# Patient Record
Sex: Male | Born: 1995 | Race: White | Hispanic: Yes | Marital: Single | Smoking: Former smoker
Health system: Southern US, Community
[De-identification: ages and names within clinical notes are randomized; demographics above are authoritative.]

## PROBLEM LIST (undated history)

## (undated) DIAGNOSIS — W3400XA Accidental discharge from unspecified firearms or gun, initial encounter: Secondary | ICD-10-CM

## (undated) DIAGNOSIS — D649 Anemia, unspecified: Secondary | ICD-10-CM

## (undated) HISTORY — PX: LAPAROSCOPIC SMALL BOWEL RESECTION: SUR793

## (undated) HISTORY — DX: Anemia, unspecified: D64.9

## (undated) HISTORY — PX: APPENDECTOMY: SHX54

## (undated) HISTORY — DX: Accidental discharge from unspecified firearms or gun, initial encounter: W34.00XA

## (undated) HISTORY — PX: THROMBECTOMY: PRO61

---
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
## 2014-10-24 ENCOUNTER — Encounter: Payer: Self-pay | Admitting: Pediatrics

## 2016-11-28 ENCOUNTER — Encounter (HOSPITAL_COMMUNITY): Payer: Self-pay | Admitting: Emergency Medicine

## 2016-11-28 ENCOUNTER — Emergency Department (HOSPITAL_COMMUNITY)
Admission: EM | Admit: 2016-11-28 | Discharge: 2016-11-28 | Disposition: A | Discharge status: Home / Self Care | Payer: Medicaid Other | Attending: Physician Assistant | Admitting: Physician Assistant

## 2016-11-28 DIAGNOSIS — Y999 Unspecified external cause status: Secondary | ICD-10-CM | POA: Insufficient documentation

## 2016-11-28 DIAGNOSIS — Y929 Unspecified place or not applicable: Secondary | ICD-10-CM | POA: Insufficient documentation

## 2016-11-28 DIAGNOSIS — T1592XA Foreign body on external eye, part unspecified, left eye, initial encounter: Secondary | ICD-10-CM | POA: Insufficient documentation

## 2016-11-28 DIAGNOSIS — X58XXXA Exposure to other specified factors, initial encounter: Secondary | ICD-10-CM | POA: Insufficient documentation

## 2016-11-28 DIAGNOSIS — Z87891 Personal history of nicotine dependence: Secondary | ICD-10-CM | POA: Insufficient documentation

## 2016-11-28 DIAGNOSIS — Y939 Activity, unspecified: Secondary | ICD-10-CM | POA: Insufficient documentation

## 2016-11-28 MED ORDER — POLYMYXIN B-TRIMETHOPRIM 10000-0.1 UNIT/ML-% OP SOLN
1.0000 [drp] | OPHTHALMIC | Status: DC
  Administered 2016-11-28: 1 [drp] via OPHTHALMIC
  Filled 2016-11-28: qty 10

## 2016-11-28 MED ORDER — FLUORESCEIN SODIUM 0.6 MG OP STRP
1.0000 | ORAL_STRIP | Freq: Once | OPHTHALMIC | Status: AC
  Administered 2016-11-28: 1 via OPHTHALMIC
  Filled 2016-11-28: qty 1

## 2016-11-28 MED ORDER — TETRACAINE HCL 0.5 % OP SOLN
2.0000 [drp] | Freq: Once | OPHTHALMIC | Status: AC
  Administered 2016-11-28: 2 [drp] via OPHTHALMIC
  Filled 2016-11-28: qty 2
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
## 2016-11-28 NOTE — ED Notes (Signed)
Waiting for Polytrim to arrive from main pharmacy

## 2016-11-28 NOTE — ED Triage Notes (Signed)
Pt states he was working under a car removing a screw and something fell in L eye 3 hours ago.  C/o pain and blurred vision to L eye.

## 2017-12-23 ENCOUNTER — Encounter (HOSPITAL_COMMUNITY): Payer: Self-pay | Admitting: Emergency Medicine

## 2017-12-23 ENCOUNTER — Other Ambulatory Visit: Payer: Self-pay

## 2017-12-23 ENCOUNTER — Emergency Department (HOSPITAL_COMMUNITY)
Admission: EM | Admit: 2017-12-23 | Discharge: 2017-12-23 | Disposition: A | Discharge status: Home / Self Care | Payer: Self-pay | Attending: Emergency Medicine | Admitting: Emergency Medicine

## 2017-12-23 DIAGNOSIS — Y9389 Activity, other specified: Secondary | ICD-10-CM | POA: Insufficient documentation

## 2017-12-23 DIAGNOSIS — S0502XA Injury of conjunctiva and corneal abrasion without foreign body, left eye, initial encounter: Secondary | ICD-10-CM | POA: Insufficient documentation

## 2017-12-23 DIAGNOSIS — X58XXXA Exposure to other specified factors, initial encounter: Secondary | ICD-10-CM | POA: Insufficient documentation

## 2017-12-23 DIAGNOSIS — Z79899 Other long term (current) drug therapy: Secondary | ICD-10-CM | POA: Insufficient documentation

## 2017-12-23 DIAGNOSIS — Y990 Civilian activity done for income or pay: Secondary | ICD-10-CM | POA: Insufficient documentation

## 2017-12-23 DIAGNOSIS — Y9289 Other specified places as the place of occurrence of the external cause: Secondary | ICD-10-CM | POA: Insufficient documentation

## 2017-12-23 DIAGNOSIS — R067 Sneezing: Secondary | ICD-10-CM | POA: Insufficient documentation

## 2017-12-23 DIAGNOSIS — J302 Other seasonal allergic rhinitis: Secondary | ICD-10-CM | POA: Insufficient documentation

## 2017-12-23 DIAGNOSIS — Z87891 Personal history of nicotine dependence: Secondary | ICD-10-CM | POA: Insufficient documentation

## 2017-12-23 MED ORDER — ERYTHROMYCIN 5 MG/GM OP OINT: TOPICAL_OINTMENT | OPHTHALMIC | 0 refills | Status: AC

## 2017-12-23 MED ORDER — LORATADINE 10 MG PO TABS
10.0000 mg | ORAL_TABLET | Freq: Once | ORAL | Status: AC
  Administered 2017-12-23: 10 mg via ORAL
  Filled 2017-12-23: qty 1

## 2017-12-23 MED ORDER — FLUTICASONE PROPIONATE 50 MCG/ACT NA SUSP: 1.0000 | Freq: Every day | NASAL | 2 refills | Status: AC

## 2017-12-23 MED ORDER — TETRACAINE HCL 0.5 % OP SOLN
1.0000 [drp] | Freq: Once | OPHTHALMIC | Status: AC
  Administered 2017-12-23: 1 [drp] via OPHTHALMIC
  Filled 2017-12-23: qty 4

## 2017-12-23 MED ORDER — CETIRIZINE HCL 10 MG PO TABS: 10.0000 mg | ORAL_TABLET | Freq: Every day | ORAL | 1 refills | Status: AC

## 2017-12-23 MED ORDER — FLUORESCEIN SODIUM 1 MG OP STRP
1.0000 | ORAL_STRIP | Freq: Once | OPHTHALMIC | Status: AC
  Administered 2017-12-23: 1 via OPHTHALMIC
  Filled 2017-12-23: qty 1

## 2017-12-23 MED ORDER — SALINE SPRAY 0.65 % NA SOLN: 1.0000 | NASAL | 0 refills | Status: AC | PRN

## 2017-12-23 NOTE — ED Notes (Signed)
PT states understanding of care given, follow up care, and medication prescribed. PT ambulated from ED to car with a steady gait. 

## 2017-12-23 NOTE — ED Triage Notes (Signed)
Pt c/o cold like symptoms for the past few days with nasal congestion, no fever or chills.

## 2017-12-23 NOTE — ED Notes (Signed)
Patient is A&Ox4.  No signs of distress noted.  Please see providers complete history and physical exam.  

## 2017-12-23 NOTE — Discharge Instructions (Addendum)
As discussed, make sure that you take an antihistamine daily and use nasal spray to help flush your sinuses.  Follow-up with ophthalmologist Dr Dione BoozeGroat in the next 2 days by calling the number above. Return if symptoms worsen, fever, chills, headache, visual disturbances vomiting or other new concerning symptoms in the meantime.
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
## 2018-06-13 ENCOUNTER — Emergency Department (HOSPITAL_COMMUNITY): Payer: No Typology Code available for payment source

## 2018-06-13 ENCOUNTER — Encounter (HOSPITAL_COMMUNITY): Payer: Self-pay | Admitting: Emergency Medicine

## 2018-06-13 ENCOUNTER — Emergency Department (HOSPITAL_COMMUNITY)
Admission: EM | Admit: 2018-06-13 | Discharge: 2018-06-13 | Disposition: A | Discharge status: Home / Self Care | Payer: No Typology Code available for payment source | Attending: Emergency Medicine | Admitting: Emergency Medicine

## 2018-06-13 ENCOUNTER — Other Ambulatory Visit: Payer: Self-pay

## 2018-06-13 DIAGNOSIS — Y929 Unspecified place or not applicable: Secondary | ICD-10-CM | POA: Diagnosis not present

## 2018-06-13 DIAGNOSIS — Z23 Encounter for immunization: Secondary | ICD-10-CM | POA: Diagnosis not present

## 2018-06-13 DIAGNOSIS — S81812A Laceration without foreign body, left lower leg, initial encounter: Secondary | ICD-10-CM | POA: Diagnosis not present

## 2018-06-13 DIAGNOSIS — Y990 Civilian activity done for income or pay: Secondary | ICD-10-CM | POA: Insufficient documentation

## 2018-06-13 DIAGNOSIS — Z87891 Personal history of nicotine dependence: Secondary | ICD-10-CM | POA: Diagnosis not present

## 2018-06-13 DIAGNOSIS — Y93H2 Activity, gardening and landscaping: Secondary | ICD-10-CM | POA: Diagnosis not present

## 2018-06-13 DIAGNOSIS — S8992XA Unspecified injury of left lower leg, initial encounter: Secondary | ICD-10-CM | POA: Diagnosis present

## 2018-06-13 DIAGNOSIS — W293XXA Contact with powered garden and outdoor hand tools and machinery, initial encounter: Secondary | ICD-10-CM | POA: Insufficient documentation

## 2018-06-13 MED ORDER — ACETAMINOPHEN 325 MG PO TABS
650.0000 mg | ORAL_TABLET | Freq: Once | ORAL | Status: AC
  Administered 2018-06-13: 650 mg via ORAL
  Filled 2018-06-13: qty 2

## 2018-06-13 MED ORDER — LIDOCAINE-EPINEPHRINE (PF) 2 %-1:200000 IJ SOLN
10.0000 mL | Freq: Once | INTRAMUSCULAR | Status: AC
  Administered 2018-06-13: 10 mL
  Filled 2018-06-13: qty 20

## 2018-06-13 MED ORDER — TETANUS-DIPHTH-ACELL PERTUSSIS 5-2.5-18.5 LF-MCG/0.5 IM SUSP
0.5000 mL | Freq: Once | INTRAMUSCULAR | Status: AC
  Administered 2018-06-13: 0.5 mL via INTRAMUSCULAR
  Filled 2018-06-13: qty 0.5

## 2018-06-13 NOTE — ED Notes (Signed)
ED Provider at bedside. 

## 2018-06-13 NOTE — Discharge Instructions (Signed)
I have placed 11 stiches to your left leg, please have them (11 sutures) removed in 10 days. Please return to the ED if you experience any of the following symptoms:  You develop severe swelling around the injury site. Your pain suddenly increases and is severe. You develop painful lumps near the wound or on skin that is anywhere on your body. You have a red streak going away from your wound. The wound is on your hand or foot and you cannot properly move a finger or toe. The wound is on your hand or foot and you notice that your fingers or toes look pale or bluish.

## 2018-06-13 NOTE — ED Notes (Signed)
Pt verbalized understanding discharge instructions and denies any further needs or questions at this time. VS stable, ambulatory and steady gait.   See EDP assessment / note.   
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
## 2018-06-13 NOTE — ED Notes (Signed)
Patient transported to X-ray 

## 2018-06-13 NOTE — ED Triage Notes (Signed)
Pt. Stated, I was sawing with chain saw and it got stuck and jumped back and cut my left leg.

## 2018-06-23 ENCOUNTER — Emergency Department (HOSPITAL_COMMUNITY)
Admission: EM | Admit: 2018-06-23 | Discharge: 2018-06-23 | Disposition: A | Discharge status: Home / Self Care | Payer: No Typology Code available for payment source | Attending: Emergency Medicine | Admitting: Emergency Medicine

## 2018-06-23 ENCOUNTER — Encounter (HOSPITAL_COMMUNITY): Payer: Self-pay

## 2018-06-23 DIAGNOSIS — Z4802 Encounter for removal of sutures: Secondary | ICD-10-CM | POA: Diagnosis not present

## 2018-06-23 NOTE — ED Triage Notes (Signed)
Pt presents for suture removal. Sutures placed 8/6 after laceration to L lower leg from chainsaw. Patient denies S/S of infection.
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
## 2018-06-23 NOTE — Discharge Instructions (Addendum)
You stitches were removed today and the skin was closed without areas of redness or swelling. You can keep taking Tylenol for pain if needed. Return if any new or concerning symptoms.  Get help right away if: You have a red streak coming from your cut. Your cut bleeds through the bandage and the bleeding does not stop with gentle pressure. The edges of your cut open up and separate. You have very bad (severe) pain. You have a rash. You are confused. You pass out (faint). You have trouble breathing and you have a fast heartbeat.

## 2018-12-19 ENCOUNTER — Emergency Department (HOSPITAL_BASED_OUTPATIENT_CLINIC_OR_DEPARTMENT_OTHER)
Admit: 2018-12-19 | Discharge: 2018-12-19 | Disposition: A | Discharge status: Home / Self Care | Payer: Self-pay | Attending: Emergency Medicine | Admitting: Emergency Medicine

## 2018-12-19 ENCOUNTER — Emergency Department (HOSPITAL_COMMUNITY): Payer: Self-pay

## 2018-12-19 ENCOUNTER — Emergency Department (HOSPITAL_COMMUNITY)
Admission: EM | Admit: 2018-12-19 | Discharge: 2018-12-19 | Disposition: A | Discharge status: Home / Self Care | Payer: Self-pay | Attending: Emergency Medicine | Admitting: Emergency Medicine

## 2018-12-19 ENCOUNTER — Encounter (HOSPITAL_COMMUNITY): Payer: Self-pay | Admitting: Emergency Medicine

## 2018-12-19 DIAGNOSIS — R609 Edema, unspecified: Secondary | ICD-10-CM

## 2018-12-19 DIAGNOSIS — Z87891 Personal history of nicotine dependence: Secondary | ICD-10-CM | POA: Insufficient documentation

## 2018-12-19 DIAGNOSIS — L03116 Cellulitis of left lower limb: Secondary | ICD-10-CM | POA: Insufficient documentation

## 2018-12-19 DIAGNOSIS — Z79899 Other long term (current) drug therapy: Secondary | ICD-10-CM | POA: Insufficient documentation

## 2018-12-19 DIAGNOSIS — L0390 Cellulitis, unspecified: Secondary | ICD-10-CM

## 2018-12-19 LAB — CBC WITH DIFFERENTIAL/PLATELET
Abs Immature Granulocytes: 0.02 10*3/uL (ref 0.00–0.07)
Basophils Absolute: 0.1 10*3/uL (ref 0.0–0.1)
Basophils Relative: 1 %
Eosinophils Absolute: 0.1 10*3/uL (ref 0.0–0.5)
Eosinophils Relative: 1 %
HCT: 44.2 % (ref 39.0–52.0)
Hemoglobin: 14.2 g/dL (ref 13.0–17.0)
Immature Granulocytes: 0 %
Lymphocytes Relative: 18 %
Lymphs Abs: 1.8 10*3/uL (ref 0.7–4.0)
MCH: 27.9 pg (ref 26.0–34.0)
MCHC: 32.1 g/dL (ref 30.0–36.0)
MCV: 86.8 fL (ref 80.0–100.0)
Monocytes Absolute: 0.8 10*3/uL (ref 0.1–1.0)
Monocytes Relative: 8 %
Neutro Abs: 7.1 10*3/uL (ref 1.7–7.7)
Neutrophils Relative %: 72 %
Platelets: 228 10*3/uL (ref 150–400)
RBC: 5.09 MIL/uL (ref 4.22–5.81)
RDW: 12.5 % (ref 11.5–15.5)
WBC: 10 10*3/uL (ref 4.0–10.5)
nRBC: 0 % (ref 0.0–0.2)

## 2018-12-19 LAB — BASIC METABOLIC PANEL
Anion gap: 12 (ref 5–15)
BUN: 13 mg/dL (ref 6–20)
CO2: 23 mmol/L (ref 22–32)
Calcium: 9.3 mg/dL (ref 8.9–10.3)
Chloride: 103 mmol/L (ref 98–111)
Creatinine, Ser: 1.08 mg/dL (ref 0.61–1.24)
GFR calc Af Amer: >60 mL/min (ref >60)
GFR calc non Af Amer: >60 mL/min (ref >60)
Glucose, Bld: 132 mg/dL — ABNORMAL HIGH (ref 70–99)
Potassium: 3.8 mmol/L (ref 3.5–5.1)
Sodium: 138 mmol/L (ref 135–145)

## 2018-12-19 MED ORDER — CEPHALEXIN 250 MG PO CAPS
500.0000 mg | ORAL_CAPSULE | Freq: Once | ORAL | Status: AC
  Administered 2018-12-19: 500 mg via ORAL
  Filled 2018-12-19: qty 2

## 2018-12-19 MED ORDER — CEPHALEXIN 500 MG PO CAPS: 500.0000 mg | ORAL_CAPSULE | Freq: Four times a day (QID) | ORAL | 0 refills | Status: AC

## 2018-12-19 MED ORDER — SULFAMETHOXAZOLE-TRIMETHOPRIM 800-160 MG PO TABS: 1.0000 | ORAL_TABLET | Freq: Two times a day (BID) | ORAL | 0 refills | Status: AC

## 2018-12-19 MED ORDER — SULFAMETHOXAZOLE-TRIMETHOPRIM 800-160 MG PO TABS
1.0000 | ORAL_TABLET | Freq: Once | ORAL | Status: AC
  Administered 2018-12-19: 1 via ORAL
  Filled 2018-12-19: qty 1
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
## 2018-12-19 NOTE — Discharge Instructions (Signed)
Please see the information and instructions below regarding your visit.  Your diagnoses today include:  1. Cellulitis, unspecified cellulitis site    Cellulitis is a superficial skin infection. Please take your antibiotics as prescribed for their ENTIRE prescribed duration.   Tests performed today include: See side panel of your discharge paperwork for testing performed today. Vital signs are listed at the bottom of these instructions.   Medications prescribed:    Take any prescribed medications only as prescribed, and any over the counter medications only as directed on the packaging.  1. Please take all of your antibiotics until finished.   You may develop abdominal discomfort or nausea from the antibiotic. If this occurs, you may take it with food. Some patients also get diarrhea with antibiotics. You may help offset this with probiotics which you can buy or get in yogurt. Do not eat or take the probiotics until 2 hours after your antibiotic. Some women develop vaginal yeast infections after antibiotics. If you develop unusual vaginal discharge after being on this medication, please see your primary care provider.   Some people develop allergies to antibiotics. Symptoms of antibiotic allergy can be mild and include a flat rash and itching. They can also be more serious and include:  ?Hives - Hives are raised, red patches of skin that are usually very itchy.  ?Lip or tongue swelling  ?Trouble swallowing or breathing  ?Blistering of the skin or mouth.  If you have any of these serious symptoms, please seek emergency medical care immediately.  Home care instructions:  Please follow any educational materials contained in this packet.    Keep affected area above the level of your heart when possible. Wash area gently twice a day with warm soapy water. Do not apply alcohol or hydrogen peroxide. Cover the area if it draining or weeping.   Follow-up instructions: Please follow-up with  your primary care provider or the ED in 48 hours for a check of the infection if symptoms are not improving.   Return instructions:  Please return to the Emergency Department if you experience worsening symptoms. Call your doctor sooner or return to the ER if you develop worsening signs of infection such as: increased redness, increased pain, pus, fever, or other symptoms that concern you. Please monitor the area we marked with a pen today. Please return if you have any other emergent concerns.  Additional Information:   Your vital signs today were: BP 109/73    Pulse 88    Temp 99.7 F (37.6 C) (Oral)    Resp 18    SpO2 100%  If your blood pressure (BP) was elevated on multiple readings during this visit above 130 for the top number or above 80 for the bottom number, please have this repeated by your primary care provider within one month. --------------  Thank you for allowing us to participate in your care today. It was a pleasure taking care of you today!

## 2018-12-19 NOTE — ED Notes (Signed)
Pt alert and oriented in NAD. Pt verbalized understanding of discharge instructions. 

## 2018-12-19 NOTE — Progress Notes (Signed)
Left lower extremity venous duplex has been completed. Preliminary results can be found in CV Proc through chart review.  Results were given to [facility] PA.  12/19/18 11:22 AM Olen Cordial RVT
